# Patient Record
Sex: Male | Born: 1950 | Marital: Married
Health system: Southern US, Community
[De-identification: ages and names within clinical notes are randomized; demographics above are authoritative.]

## PROBLEM LIST (undated history)

## (undated) DIAGNOSIS — G8929 Other chronic pain: Secondary | ICD-10-CM

## (undated) DIAGNOSIS — E119 Type 2 diabetes mellitus without complications: Secondary | ICD-10-CM

## (undated) DIAGNOSIS — I10 Essential (primary) hypertension: Secondary | ICD-10-CM

## (undated) DIAGNOSIS — I4891 Unspecified atrial fibrillation: Secondary | ICD-10-CM

## (undated) DIAGNOSIS — J449 Chronic obstructive pulmonary disease, unspecified: Secondary | ICD-10-CM

## (undated) DIAGNOSIS — E039 Hypothyroidism, unspecified: Secondary | ICD-10-CM

---

## 2014-03-23 ENCOUNTER — Inpatient Hospital Stay
Admission: RE | Admit: 2014-03-23 | Discharge: 2014-05-01 | Disposition: E | Payer: Medicare HMO | Source: Ambulatory Visit | Attending: Internal Medicine | Admitting: Internal Medicine

## 2014-03-23 ENCOUNTER — Other Ambulatory Visit (HOSPITAL_COMMUNITY): Payer: Self-pay

## 2014-03-23 DIAGNOSIS — J80 Acute respiratory distress syndrome: Secondary | ICD-10-CM | POA: Insufficient documentation

## 2014-03-23 DIAGNOSIS — C3490 Malignant neoplasm of unspecified part of unspecified bronchus or lung: Secondary | ICD-10-CM | POA: Insufficient documentation

## 2014-03-23 DIAGNOSIS — J9601 Acute respiratory failure with hypoxia: Secondary | ICD-10-CM

## 2014-03-23 DIAGNOSIS — I82C12 Acute embolism and thrombosis of left internal jugular vein: Secondary | ICD-10-CM

## 2014-03-23 DIAGNOSIS — J9690 Respiratory failure, unspecified, unspecified whether with hypoxia or hypercapnia: Secondary | ICD-10-CM

## 2014-03-23 DIAGNOSIS — I82C19 Acute embolism and thrombosis of unspecified internal jugular vein: Secondary | ICD-10-CM | POA: Insufficient documentation

## 2014-03-23 DIAGNOSIS — Z452 Encounter for adjustment and management of vascular access device: Secondary | ICD-10-CM | POA: Insufficient documentation

## 2014-03-23 HISTORY — DX: Other chronic pain: G89.29

## 2014-03-23 HISTORY — DX: Type 2 diabetes mellitus without complications: E11.9

## 2014-03-23 HISTORY — DX: Essential (primary) hypertension: I10

## 2014-03-23 HISTORY — DX: Hypothyroidism, unspecified: E03.9

## 2014-03-23 HISTORY — DX: Chronic obstructive pulmonary disease, unspecified: J44.9

## 2014-03-23 HISTORY — DX: Unspecified atrial fibrillation: I48.91

## 2014-03-23 LAB — BLOOD GAS, ARTERIAL
Acid-Base Excess: 15.4 mmol/L — ABNORMAL HIGH (ref 0.0–2.0)
Bicarbonate: 41.2 mEq/L — ABNORMAL HIGH (ref 20.0–24.0)
FIO2: 0.8 %
LHR: 15 {breaths}/min
O2 SAT: 97 %
PATIENT TEMPERATURE: 98.6
PEEP/CPAP: 8 cmH2O
TCO2: 43.3 mmol/L (ref 0–100)
VT: 500 mL
pCO2 arterial: 68.6 mmHg (ref 35.0–45.0)
pH, Arterial: 7.395 (ref 7.350–7.450)
pO2, Arterial: 95 mmHg (ref 80.0–100.0)

## 2014-03-24 LAB — BLOOD GAS, ARTERIAL
ACID-BASE EXCESS: 15.6 mmol/L — AB (ref 0.0–2.0)
Bicarbonate: 41.4 mEq/L — ABNORMAL HIGH (ref 20.0–24.0)
FIO2: 0.7 %
MECHVT: 500 mL
O2 SAT: 98.4 %
PCO2 ART: 70.5 mmHg — AB (ref 35.0–45.0)
PEEP: 8 cmH2O
Patient temperature: 98.6
RATE: 15 resp/min
TCO2: 43.6 mmol/L (ref 0–100)
pH, Arterial: 7.387 (ref 7.350–7.450)
pO2, Arterial: 93.7 mmHg (ref 80.0–100.0)

## 2014-03-24 LAB — CBC WITH DIFFERENTIAL/PLATELET
Basophils Absolute: 0 10*3/uL (ref 0.0–0.1)
Basophils Relative: 0 % (ref 0–1)
EOS ABS: 0.2 10*3/uL (ref 0.0–0.7)
Eosinophils Relative: 3 % (ref 0–5)
HCT: 25.8 % — ABNORMAL LOW (ref 39.0–52.0)
HEMOGLOBIN: 7.3 g/dL — AB (ref 13.0–17.0)
Lymphocytes Relative: 6 % — ABNORMAL LOW (ref 12–46)
Lymphs Abs: 0.4 10*3/uL — ABNORMAL LOW (ref 0.7–4.0)
MCH: 28.7 pg (ref 26.0–34.0)
MCHC: 28.3 g/dL — AB (ref 30.0–36.0)
MCV: 101.6 fL — ABNORMAL HIGH (ref 78.0–100.0)
MONOS PCT: 10 % (ref 3–12)
Monocytes Absolute: 0.7 10*3/uL (ref 0.1–1.0)
Neutro Abs: 5.8 10*3/uL (ref 1.7–7.7)
Neutrophils Relative %: 81 % — ABNORMAL HIGH (ref 43–77)
Platelets: 248 10*3/uL (ref 150–400)
RBC: 2.54 MIL/uL — ABNORMAL LOW (ref 4.22–5.81)
RDW: 18.8 % — ABNORMAL HIGH (ref 11.5–15.5)
WBC: 7.2 10*3/uL (ref 4.0–10.5)

## 2014-03-24 LAB — C-REACTIVE PROTEIN: CRP: 22.7 mg/dL — ABNORMAL HIGH (ref <0.60)

## 2014-03-24 LAB — FERRITIN: FERRITIN: 411 ng/mL — AB (ref 22–322)

## 2014-03-24 LAB — COMPREHENSIVE METABOLIC PANEL
ALT: 13 U/L (ref 0–53)
AST: 16 U/L (ref 0–37)
Albumin: 1.8 g/dL — ABNORMAL LOW (ref 3.5–5.2)
Alkaline Phosphatase: 83 U/L (ref 39–117)
Anion gap: 7 (ref 5–15)
BUN: 46 mg/dL — ABNORMAL HIGH (ref 6–23)
CO2: 41 mEq/L (ref 19–32)
Calcium: 9.1 mg/dL (ref 8.4–10.5)
Chloride: 98 mEq/L (ref 96–112)
Creatinine, Ser: 1.24 mg/dL (ref 0.50–1.35)
GFR calc Af Amer: 69 mL/min — ABNORMAL LOW (ref >90)
GFR, EST NON AFRICAN AMERICAN: 60 mL/min — AB (ref >90)
Glucose, Bld: 177 mg/dL — ABNORMAL HIGH (ref 70–99)
Potassium: 5.2 mEq/L (ref 3.7–5.3)
Sodium: 146 mEq/L (ref 137–147)
Total Bilirubin: 0.7 mg/dL (ref 0.3–1.2)
Total Protein: 6.9 g/dL (ref 6.0–8.3)

## 2014-03-24 LAB — HEMOGLOBIN A1C
HEMOGLOBIN A1C: 5.8 % — AB (ref <5.7)
Mean Plasma Glucose: 120 mg/dL — ABNORMAL HIGH (ref <117)

## 2014-03-24 LAB — IRON AND TIBC
Iron: 16 ug/dL — ABNORMAL LOW (ref 42–135)
Saturation Ratios: 7 % — ABNORMAL LOW (ref 20–55)
TIBC: 230 ug/dL (ref 215–435)
UIBC: 214 ug/dL (ref 125–400)

## 2014-03-24 LAB — TSH: TSH: 3.02 u[IU]/mL (ref 0.350–4.500)

## 2014-03-24 LAB — GAMMA GT: GGT: 11 U/L (ref 7–51)

## 2014-03-24 LAB — VITAMIN B12: Vitamin B-12: 493 pg/mL (ref 211–911)

## 2014-03-24 LAB — PROTIME-INR
INR: 1.12 (ref 0.00–1.49)
PROTHROMBIN TIME: 14.5 s (ref 11.6–15.2)

## 2014-03-24 LAB — DIGOXIN LEVEL
Digoxin Level: 1.3 ng/mL (ref 0.8–2.0)
Digoxin Level: 1.2 ng/mL (ref 0.8–2.0)

## 2014-03-24 LAB — MAGNESIUM: Magnesium: 2.7 mg/dL — ABNORMAL HIGH (ref 1.5–2.5)

## 2014-03-24 LAB — PROCALCITONIN: PROCALCITONIN: 1.44 ng/mL

## 2014-03-24 LAB — PHOSPHORUS: PHOSPHORUS: 4 mg/dL (ref 2.3–4.6)

## 2014-03-25 ENCOUNTER — Other Ambulatory Visit (HOSPITAL_COMMUNITY): Payer: Self-pay

## 2014-03-25 ENCOUNTER — Encounter: Payer: Self-pay | Admitting: Adult Health

## 2014-03-25 DIAGNOSIS — J9601 Acute respiratory failure with hypoxia: Secondary | ICD-10-CM | POA: Diagnosis not present

## 2014-03-25 DIAGNOSIS — J9690 Respiratory failure, unspecified, unspecified whether with hypoxia or hypercapnia: Secondary | ICD-10-CM | POA: Insufficient documentation

## 2014-03-25 DIAGNOSIS — J80 Acute respiratory distress syndrome: Secondary | ICD-10-CM | POA: Insufficient documentation

## 2014-03-25 DIAGNOSIS — J9600 Acute respiratory failure, unspecified whether with hypoxia or hypercapnia: Secondary | ICD-10-CM

## 2014-03-25 DIAGNOSIS — C3490 Malignant neoplasm of unspecified part of unspecified bronchus or lung: Secondary | ICD-10-CM | POA: Diagnosis not present

## 2014-03-25 DIAGNOSIS — Z452 Encounter for adjustment and management of vascular access device: Secondary | ICD-10-CM | POA: Diagnosis not present

## 2014-03-25 LAB — BASIC METABOLIC PANEL
ANION GAP: 7 (ref 5–15)
BUN: 82 mg/dL — AB (ref 6–23)
CALCIUM: 8.9 mg/dL (ref 8.4–10.5)
CO2: 40 meq/L — AB (ref 19–32)
CREATININE: 1.52 mg/dL — AB (ref 0.50–1.35)
Chloride: 97 mEq/L (ref 96–112)
GFR calc Af Amer: 54 mL/min — ABNORMAL LOW (ref >90)
GFR calc non Af Amer: 47 mL/min — ABNORMAL LOW (ref >90)
Glucose, Bld: 232 mg/dL — ABNORMAL HIGH (ref 70–99)
Potassium: 5.5 mEq/L — ABNORMAL HIGH (ref 3.7–5.3)
Sodium: 144 mEq/L (ref 137–147)

## 2014-03-25 LAB — PROTIME-INR
INR: 1.16 (ref 0.00–1.49)
PROTHROMBIN TIME: 14.9 s (ref 11.6–15.2)

## 2014-03-25 LAB — HEMOGLOBIN AND HEMATOCRIT, BLOOD
HEMATOCRIT: 25.9 % — AB (ref 39.0–52.0)
Hemoglobin: 7.4 g/dL — ABNORMAL LOW (ref 13.0–17.0)

## 2014-03-25 NOTE — Consult Note (Signed)
Name: Joseph Mack MRN: [id number] DOB: 1951-04-20    ADMISSION DATE:  03/22/2014 CONSULTATION DATE:  03/25/2014  REFERRING MD :  Dahlia Client  CHIEF COMPLAINT:  VDRF  BRIEF PATIENT DESCRIPTION: 63 y.o. M with severe COPD on 8 - 12 L high flow O2 chronically, recently admitted to [facility] and underwent left VATS with bullectomy, complete decortication, wedge resection, visceral pleurectomy.  Unable to be liberated from vent post op, required trach/PEG.  Transferred to [facility] 11/23, PCCM consulted for assistance with vent management.  SIGNIFICANT EVENTS  10/26 - VATS at [facility] (LUL bullectomy, lysis, complete decortication, wedge resection, visceral pleurectomy) 11/10 - trach 11/20 - PEG 11/23 - transferred to [facility] 11/25 - PCCM consulted  STUDIES:  CXR 11/125 >>> pulm edema, emphysema, LUL bullectomy.   HISTORY OF PRESENT ILLNESS:  Pt is encephelopathic; therefore, this HPI is obtained from chart review. Joseph Mack is a 63 y.o. 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 He was admitted to [facility] on 10/26 and underwent VATS with fluoroscopic bullectomy of LUL apical bulla, thorascopic lysis, complete thorascopic decortication, wedge resection, thorascopic visceral pleurectomy and left chest tube placement. Pt was intubated prior to surgery but due to severe underlying lung disease, was unable to be liberated from the ventilator.  He subsequently required tracheostomy and since then has had high FiO2 and PEEP requirements to maintain SpO2 > 85% (70 - 85% FiO2 and 8-12 PEEP).  Pt was transferred to [facility] on 11/23 and PCCM was consulted for assistance with vent management.  Note prior to admission, pt was on 8 - 12 L of high flow O2 24/7.  PAST MEDICAL HISTORY :   has a past medical history of COPD, severe; Diabetes; Hypertension; Atrial  fibrillation; Hypothyroid; and Chronic pain.  has no past surgical history on file. Prior to Admission medications   Not on File   Allergies not on file  FAMILY HISTORY:  family history is not on file. SOCIAL HISTORY:    REVIEW OF SYSTEMS:  Unable to obtain as pt is encephalopathic.  SUBJECTIVE:   VITAL SIGNS:  VSS.    PHYSICAL EXAMINATION: General: Morbidly obese male, resting in bed, in NAD. Neuro: Sedated, follows commands intermittently. HEENT: [place]/AT. PERRL, sclerae anicteric.  #6 XLT trach in place. Cardiovascular: RRR, no M/R/G.  Lungs: Respirations shallow, decreased air movement, scattered wheezes.  On trach. Abdomen: Morbidly obese, PEG in place, BS x 4, soft, NT/ND.  Musculoskeletal: No gross deformities, no edema. Venous stasis changes Skin: Intact, warm, no rashes.   Recent Labs Lab 03/24/14 0642  NA 146  K 5.2  CL 98  CO2 41*  BUN 46*  CREATININE 1.24  GLUCOSE 177*    Recent Labs Lab 03/24/14 0642 03/25/14 0624  HGB 7.3* 7.4*  HCT 25.8* 25.9*  WBC 7.2  --   PLT 248  --    Dg Chest Port 1 View  03/24/2014   CLINICAL DATA:  Respiratory distress  EXAM: PORTABLE CHEST - 1 VIEW  COMPARISON:  01/21/2014  FINDINGS: Tracheostomy tube is present.  A previously noted emphysematous bleb at the left apex is no longer visualized. This was likely resected given the split lung scan was performed 01/21/2014. Emphysematous changes are noted in the remaining lungs. There is diffuse interstitial opacity. Cardiomegaly which is chronic. No gross change in upper mediastinal contours, distorted by the new volume loss  on the left.  IMPRESSION: 1. Pulmonary edema superimposed on emphysema. 2. Interval left blebectomy or upper lobectomy. Without more recent comparison, an opacity at the left apex is indeterminate - cannot exclude layering pleural fluid.   Electronically Signed   By: Jorje Guild M.D.   On: 03/24/2014 02:38    ASSESSMENT / PLAN:  VDRF s/p  trach Tracheostomy status Severe COPD per report (no PFT's available) - s/p LUL bullectomy and wedge resection, visceral pleurectomy. Recs: Continue full vent support, appears to be doing well on AC. abg reviewed, keep same mV VAP bundle. Anticipate difficulties in weaning given long hx of high O2 demands (8 - 12 L high flow O2).  Will likely be vent dependent Continue budesonide, duonebs, prednisone. Follow cultures (tracheal aspirate sent). CXR in AM. Keep neg in balance After secretions improve, could consider high flow O2 trach collar if wob is not an issue At some stage may assess fixed shunt and if peep to 5 even changes any O2 needs For today goal to 60% if able Would be reasonable to check PA pressures  Montey Hora, PA - [facility] [place] [facility] Pgr: [phone]  or [phone] 03/25/2014, 11:35 AM   STAFF NOTE: I, Merrie Roof, MD FACP have personally reviewed patient's available data, including medical history, events of note, physical examination and test results as part of my evaluation. I have discussed with resident/NP and other care providers such as pharmacist, RN and RRT. In addition, I personally evaluated patient and elicited key findings of: obese, distant BS, may be able to attempt TC with high O2 in future if wob not an issue recognizing   Lavon Paganini. Titus Mould, MD, [place] Pgr: [place] [facility] 03/25/2014 2:27 PM

## 2014-03-26 LAB — CULTURE, RESPIRATORY W GRAM STAIN

## 2014-03-26 LAB — BASIC METABOLIC PANEL
Anion gap: 6 (ref 5–15)
BUN: 90 mg/dL — ABNORMAL HIGH (ref 6–23)
CO2: 42 meq/L — AB (ref 19–32)
CREATININE: 1.53 mg/dL — AB (ref 0.50–1.35)
Calcium: 9 mg/dL (ref 8.4–10.5)
Chloride: 102 mEq/L (ref 96–112)
GFR calc Af Amer: 54 mL/min — ABNORMAL LOW (ref >90)
GFR calc non Af Amer: 46 mL/min — ABNORMAL LOW (ref >90)
GLUCOSE: 176 mg/dL — AB (ref 70–99)
Potassium: 5 mEq/L (ref 3.7–5.3)
Sodium: 150 mEq/L — ABNORMAL HIGH (ref 137–147)

## 2014-03-26 LAB — ABO/RH: ABO/RH(D): O POS

## 2014-03-26 LAB — CULTURE, RESPIRATORY

## 2014-03-26 LAB — CBC
HEMATOCRIT: 25.8 % — AB (ref 39.0–52.0)
Hemoglobin: 7.1 g/dL — ABNORMAL LOW (ref 13.0–17.0)
MCH: 29.2 pg (ref 26.0–34.0)
MCHC: 27.5 g/dL — ABNORMAL LOW (ref 30.0–36.0)
MCV: 106.2 fL — ABNORMAL HIGH (ref 78.0–100.0)
Platelets: 302 10*3/uL (ref 150–400)
RBC: 2.43 MIL/uL — AB (ref 4.22–5.81)
RDW: 18 % — ABNORMAL HIGH (ref 11.5–15.5)
WBC: 6.6 10*3/uL (ref 4.0–10.5)

## 2014-03-26 LAB — PROTIME-INR
INR: 1.23 (ref 0.00–1.49)
Prothrombin Time: 15.7 seconds — ABNORMAL HIGH (ref 11.6–15.2)

## 2014-03-26 LAB — PREPARE RBC (CROSSMATCH)

## 2014-03-27 DIAGNOSIS — J9601 Acute respiratory failure with hypoxia: Secondary | ICD-10-CM

## 2014-03-27 DIAGNOSIS — J80 Acute respiratory distress syndrome: Secondary | ICD-10-CM

## 2014-03-27 LAB — CBC
HEMATOCRIT: 29 % — AB (ref 39.0–52.0)
Hemoglobin: 8.2 g/dL — ABNORMAL LOW (ref 13.0–17.0)
MCH: 28.6 pg (ref 26.0–34.0)
MCHC: 28.3 g/dL — AB (ref 30.0–36.0)
MCV: 101 fL — ABNORMAL HIGH (ref 78.0–100.0)
PLATELETS: 290 10*3/uL (ref 150–400)
RBC: 2.87 MIL/uL — ABNORMAL LOW (ref 4.22–5.81)
RDW: 18.6 % — AB (ref 11.5–15.5)
WBC: 6.6 10*3/uL (ref 4.0–10.5)

## 2014-03-27 LAB — PROTIME-INR
INR: 1.29 (ref 0.00–1.49)
Prothrombin Time: 16.2 seconds — ABNORMAL HIGH (ref 11.6–15.2)

## 2014-03-27 LAB — BASIC METABOLIC PANEL
Anion gap: 10 (ref 5–15)
Anion gap: 9 (ref 5–15)
BUN: 130 mg/dL — ABNORMAL HIGH (ref 6–23)
BUN: 130 mg/dL — AB (ref 6–23)
CALCIUM: 8.6 mg/dL (ref 8.4–10.5)
CALCIUM: 8.5 mg/dL (ref 8.4–10.5)
CO2: 38 meq/L — AB (ref 19–32)
CO2: 37 mEq/L — ABNORMAL HIGH (ref 19–32)
CREATININE: 2.31 mg/dL — AB (ref 0.50–1.35)
Chloride: 100 mEq/L (ref 96–112)
Chloride: 99 mEq/L (ref 96–112)
Creatinine, Ser: 2.53 mg/dL — ABNORMAL HIGH (ref 0.50–1.35)
GFR calc Af Amer: 33 mL/min — ABNORMAL LOW (ref >90)
GFR, EST AFRICAN AMERICAN: 29 mL/min — AB (ref >90)
GFR, EST NON AFRICAN AMERICAN: 28 mL/min — AB (ref >90)
GFR, EST NON AFRICAN AMERICAN: 25 mL/min — AB (ref >90)
GLUCOSE: 162 mg/dL — AB (ref 70–99)
Glucose, Bld: 139 mg/dL — ABNORMAL HIGH (ref 70–99)
Potassium: 6.6 mEq/L (ref 3.7–5.3)
Potassium: 6.5 mEq/L (ref 3.7–5.3)
SODIUM: 148 meq/L — AB (ref 137–147)
Sodium: 145 mEq/L (ref 137–147)

## 2014-03-27 LAB — POTASSIUM
Potassium: 7.5 mEq/L (ref 3.7–5.3)
Potassium: 6.4 mEq/L — ABNORMAL HIGH (ref 3.7–5.3)

## 2014-03-27 NOTE — Progress Notes (Signed)
   Name: Joseph Mack MRN: [id number] DOB: 04-Nov-1950    ADMISSION DATE:  03/22/2014 CONSULTATION DATE:  03/25/2014  REFERRING MD :  Dahlia Client  CHIEF COMPLAINT:  VDRF  BRIEF PATIENT DESCRIPTION: 63 y.o. M with severe COPD on 8 - 12 L high flow O2 chronically, recently admitted to [facility] and underwent left VATS with bullectomy, complete decortication, wedge resection, visceral pleurectomy.  Unable to be liberated from vent post op, required trach/PEG.  Transferred to [facility] 11/23, PCCM consulted for assistance with vent management.  SIGNIFICANT EVENTS  10/26 - VATS at [facility] (LUL bullectomy, lysis, complete decortication, wedge resection, visceral pleurectomy) 11/10 - trach 11/20 - PEG 11/23 - transferred to [facility] 11/25 - PCCM consulted  STUDIES:  CXR 11/125 >>> pulm edema, emphysema, LUL bullectomy.  SUBJECTIVE:  No events over night   VITAL SIGNS:  VSS.    PHYSICAL EXAMINATION: General: Morbidly obese male, resting in bed, in NAD. Neuro: Sedated, follows commands intermittently. HEENT: [place]/AT. PERRL, sclerae anicteric.  #6 XLT trach in place. Cardiovascular: RRR, no M/R/G.  Lungs: Respirations shallow, decreased air movement, scattered wheezes.  On trach. Abdomen: Morbidly obese, PEG in place, BS x 4, soft, NT/ND.  Musculoskeletal: No gross deformities, no edema. Venous stasis changes Skin: Intact, warm, no rashes.   Recent Labs Lab 03/25/14 1455 03/26/14 0615 03/27/14 0714  NA 144 150* 148*  K 5.5* 5.0 6.6*  CL 97 102 100  CO2 40* 42* 38*  BUN 82* 90* 130*  CREATININE 1.52* 1.53* 2.31*  GLUCOSE 232* 176* 139*    Recent Labs Lab 03/24/14 0642 03/25/14 0624 03/26/14 0615 03/27/14 0714  HGB 7.3* 7.4* 7.1* 8.2*  HCT 25.8* 25.9* 25.8* 29.0*  WBC 7.2  --  6.6 6.6  PLT 248  --  302 290   No results found.  ASSESSMENT / PLAN:  VDRF s/p trach Tracheostomy status Severe COPD per report (no PFT's available) - s/p LUL bullectomy and wedge resection, visceral  pleurectomy. Recs: Continue full vent support, appears to be doing well on AC. abg reviewed, keep same mV VAP bundle. Anticipate difficulties in weaning given long hx of high O2 demands (8 - 12 L high flow O2).  Will likely be vent dependent Continue budesonide, duonebs, prednisone.  Mariel Sleet [place] [facility] 03/27/2014 9:20 AM

## 2014-03-28 ENCOUNTER — Other Ambulatory Visit (HOSPITAL_COMMUNITY): Payer: Self-pay

## 2014-03-28 LAB — TYPE AND SCREEN
ABO/RH(D): O POS
Antibody Screen: NEGATIVE
Unit division: 0
Unit division: 0

## 2014-03-28 LAB — BASIC METABOLIC PANEL
Anion gap: 11 (ref 5–15)
BUN: 146 mg/dL — AB (ref 6–23)
CHLORIDE: 95 meq/L — AB (ref 96–112)
CO2: 39 meq/L — AB (ref 19–32)
Calcium: 8.5 mg/dL (ref 8.4–10.5)
Creatinine, Ser: 2.67 mg/dL — ABNORMAL HIGH (ref 0.50–1.35)
GFR calc non Af Amer: 24 mL/min — ABNORMAL LOW (ref >90)
GFR, EST AFRICAN AMERICAN: 27 mL/min — AB (ref >90)
Glucose, Bld: 177 mg/dL — ABNORMAL HIGH (ref 70–99)
POTASSIUM: 5.6 meq/L — AB (ref 3.7–5.3)
Sodium: 145 mEq/L (ref 137–147)

## 2014-03-28 LAB — PROTIME-INR
INR: 1.46 (ref 0.00–1.49)
Prothrombin Time: 17.9 seconds — ABNORMAL HIGH (ref 11.6–15.2)

## 2014-03-28 LAB — HEMOGLOBIN AND HEMATOCRIT, BLOOD
HEMATOCRIT: 28.7 % — AB (ref 39.0–52.0)
Hemoglobin: 8 g/dL — ABNORMAL LOW (ref 13.0–17.0)

## 2014-03-29 ENCOUNTER — Other Ambulatory Visit (HOSPITAL_COMMUNITY): Payer: Self-pay

## 2014-03-29 LAB — POTASSIUM: Potassium: 5 mEq/L (ref 3.7–5.3)

## 2014-03-29 LAB — PROTIME-INR
INR: 1.71 — AB (ref 0.00–1.49)
PROTHROMBIN TIME: 20.2 s — AB (ref 11.6–15.2)

## 2014-03-30 ENCOUNTER — Other Ambulatory Visit (HOSPITAL_COMMUNITY): Payer: Self-pay

## 2014-03-30 DIAGNOSIS — J80 Acute respiratory distress syndrome: Secondary | ICD-10-CM | POA: Diagnosis not present

## 2014-03-30 DIAGNOSIS — J9601 Acute respiratory failure with hypoxia: Secondary | ICD-10-CM | POA: Diagnosis not present

## 2014-03-30 DIAGNOSIS — C3490 Malignant neoplasm of unspecified part of unspecified bronchus or lung: Secondary | ICD-10-CM | POA: Diagnosis not present

## 2014-03-30 LAB — RENAL FUNCTION PANEL
Albumin: 1.5 g/dL — ABNORMAL LOW (ref 3.5–5.2)
Anion gap: 13 (ref 5–15)
BUN: 183 mg/dL — AB (ref 6–23)
CALCIUM: 8 mg/dL — AB (ref 8.4–10.5)
CHLORIDE: 91 meq/L — AB (ref 96–112)
CO2: 39 meq/L — AB (ref 19–32)
Creatinine, Ser: 2.78 mg/dL — ABNORMAL HIGH (ref 0.50–1.35)
GFR calc Af Amer: 26 mL/min — ABNORMAL LOW (ref >90)
GFR, EST NON AFRICAN AMERICAN: 23 mL/min — AB (ref >90)
GLUCOSE: 115 mg/dL — AB (ref 70–99)
Phosphorus: 5.2 mg/dL — ABNORMAL HIGH (ref 2.3–4.6)
Potassium: 5 mEq/L (ref 3.7–5.3)
Sodium: 143 mEq/L (ref 137–147)

## 2014-03-30 LAB — BLOOD GAS, ARTERIAL
Acid-Base Excess: 15.7 mmol/L — ABNORMAL HIGH (ref 0.0–2.0)
BICARBONATE: 41.9 meq/L — AB (ref 20.0–24.0)
FIO2: 1 %
O2 SAT: 98.1 %
PEEP: 12 cmH2O
Patient temperature: 98.6
RATE: 12 resp/min
TCO2: 44.2 mmol/L (ref 0–100)
VT: 500 mL
pCO2 arterial: 76.7 mmHg (ref 35.0–45.0)
pH, Arterial: 7.356 (ref 7.350–7.450)
pO2, Arterial: 104 mmHg — ABNORMAL HIGH (ref 80.0–100.0)

## 2014-03-30 LAB — CBC WITH DIFFERENTIAL/PLATELET
Basophils Absolute: 0 10*3/uL (ref 0.0–0.1)
Basophils Relative: 0 % (ref 0–1)
EOS ABS: 0.1 10*3/uL (ref 0.0–0.7)
Eosinophils Relative: 2 % (ref 0–5)
HCT: 23.8 % — ABNORMAL LOW (ref 39.0–52.0)
HEMOGLOBIN: 7 g/dL — AB (ref 13.0–17.0)
LYMPHS ABS: 0.6 10*3/uL — AB (ref 0.7–4.0)
Lymphocytes Relative: 9 % — ABNORMAL LOW (ref 12–46)
MCH: 29.9 pg (ref 26.0–34.0)
MCHC: 29.4 g/dL — AB (ref 30.0–36.0)
MCV: 101.7 fL — ABNORMAL HIGH (ref 78.0–100.0)
MONOS PCT: 5 % (ref 3–12)
Monocytes Absolute: 0.3 10*3/uL (ref 0.1–1.0)
NEUTROS ABS: 5.3 10*3/uL (ref 1.7–7.7)
Neutrophils Relative %: 84 % — ABNORMAL HIGH (ref 43–77)
Platelets: 311 10*3/uL (ref 150–400)
RBC: 2.34 MIL/uL — ABNORMAL LOW (ref 4.22–5.81)
RDW: 17 % — ABNORMAL HIGH (ref 11.5–15.5)
WBC: 6.3 10*3/uL (ref 4.0–10.5)

## 2014-03-30 LAB — GRAM STAIN: SPECIAL REQUESTS: NORMAL

## 2014-03-30 LAB — PROTIME-INR
INR: 1.84 — ABNORMAL HIGH (ref 0.00–1.49)
Prothrombin Time: 21.4 seconds — ABNORMAL HIGH (ref 11.6–15.2)

## 2014-03-30 LAB — DIGOXIN LEVEL: DIGOXIN LVL: 1.8 ng/mL (ref 0.8–2.0)

## 2014-03-30 LAB — TSH: TSH: 1.79 u[IU]/mL (ref 0.350–4.500)

## 2014-03-30 LAB — MAGNESIUM: MAGNESIUM: 3.3 mg/dL — AB (ref 1.5–2.5)

## 2014-03-30 NOTE — Procedures (Signed)
Bedside Bronchoscopy Procedure Note Joseph Mack [id number] December 22, 1950  Procedure: Bronchoscopy Indications: Obtain specimens for culture and/or other diagnostic studies  Procedure Details: ET Tube Size: ET Tube secured at lip (cm): Bite block in place: No In preparation for procedure, Patient hyper-oxygenated with 100 % FiO2 Airway entered and the following bronchi were examined: RUL, RML, RLL, LUL, LLL and Bronchi.   Bronchoscope removed.  , Patient placed back on 100% FiO2 at conclusion of procedure.    Evaluation There were no vitals taken for this visit. Breath Sounds:Rhonch O2 sats: stable throughout Patient's Current Condition: stable Specimens:  Sent serosanguinous fluid Complications: No apparent complications Patient did tolerate procedure well.   Gonzella Lex 03/30/2014, 3:43 PM

## 2014-03-30 NOTE — Procedures (Signed)
Bronchoscopy Procedure Note Joseph Mack [id number] 04/22/1951  Procedure: Bronchoscopy Indications: Diagnostic evaluation of the airways, Obtain specimens for culture and/or other diagnostic studies and Remove secretions  Procedure Details Consent: Risks of procedure as well as the alternatives and risks of each were explained to the (patient/caregiver).  Consent for procedure obtained. Time Out: Verified patient identification, verified procedure, site/side was marked, verified correct patient position, special equipment/implants available, medications/allergies/relevent history reviewed, required imaging and test results available.  Performed  In preparation for procedure, patient was given 100% FiO2 and bronchoscope lubricated. Sedation: Benzodiazepines and Etomidate  Airway entered and the following bronchi were examined: RUL, RML, RLL, LUL, LLL and Bronchi.   Procedures performed: Brushings performed - no Peep to 8 prior to reduce MAP Bronchoscope removed.  , Patient placed back on 100% FiO2 at conclusion of procedure.    Evaluation Hemodynamic Status: BP stable throughout; O2 sats: stable throughout Patient's Current Condition: stable Specimens:  Sent purulent fluid Complications: No apparent complications Patient did tolerate procedure well.   Joseph Mack. 03/30/2014   1. Moderate thick white secretions BI and RLL, BAL perfooremed 2. Left wnl 3. No mass lesions 4. A ton of distal plugs lavaged RLL, RML  Tolerated well  Joseph Mack. Titus Mould, MD, [place] Pgr: [place] [facility]

## 2014-03-30 NOTE — Progress Notes (Signed)
Name: ANOOP HEMMER MRN: [id number] DOB: August 03, 1950    ADMISSION DATE:  03/06/2014 CONSULTATION DATE:  03/25/2014  REFERRING MD :  Dahlia Client  CHIEF COMPLAINT:  VDRF  BRIEF PATIENT DESCRIPTION: 63 y.o. M with severe COPD on 8 - 12 L high flow O2 chronically, recently admitted to [facility] and underwent left VATS with bullectomy, complete decortication, wedge resection, visceral pleurectomy.  Unable to be liberated from vent post op, required trach/PEG.  Transferred to [facility] 11/23, PCCM consulted for assistance with vent management.  SIGNIFICANT EVENTS  10/26 - VATS at [facility] (LUL bullectomy, lysis, complete decortication, wedge resection, visceral pleurectomy) 11/10 - trach 11/20 - PEG 11/23 - transferred to [facility] 11/25 - PCCM consulted  STUDIES:  CXR 11/125 >>> pulm edema, emphysema, LUL bullectomy.  SUBJECTIVE:  Now on High FIO2  VITAL SIGNS:  VSS.    PHYSICAL EXAMINATION: General: Morbidly obese male, resting in bed, in NAD. Neuro: Sedated, follows commands intermittently. HEENT: [place]/AT. PERRL, sclerae anicteric.  #6 XLT trach in place. Cardiovascular: RRR, no M/R/G.  Lungs: Respirations shallow, decreased air movement, full vent support. Abdomen: Morbidly obese, PEG in place, BS x 4, soft, NT/ND.  Musculoskeletal: No gross deformities, no edema. Venous stasis changes Skin: Intact, warm, no rashes.   Recent Labs Lab 03/27/14 1058  03/28/14 0421 03/29/14 0425 03/30/14 0234  NA 145  --  145  --  143  K 6.5*  < > 5.6* 5.0 5.0  CL 99  --  95*  --  91*  CO2 37*  --  39*  --  39*  BUN 130*  --  146*  --  183*  CREATININE 2.53*  --  2.67*  --  2.78*  GLUCOSE 162*  --  177*  --  115*  < > = values in this interval not displayed.  Recent Labs Lab 03/26/14 0615 03/27/14 0714 03/28/14 0421 03/30/14 0234  HGB 7.1* 8.2* 8.0* 7.0*  HCT 25.8* 29.0* 28.7* 23.8*  WBC 6.6 6.6  --  6.3  PLT 302 290  --  311   Dg Chest Port 1 View  03/30/2014   CLINICAL DATA:  Respiratory  failure.  EXAM: PORTABLE CHEST - 1 VIEW  COMPARISON:  03/29/2014 .  FINDINGS: Tracheostomy tube in stable position. Postsurgical changes left lung. Diffuse bilateral pulmonary infiltrates with slight improvement. Left apical pleural thickening unchanged. Loculated pleural effusion cannot be excluded. No pneumothorax. No acute osseus abnormality P  IMPRESSION: 1. Postsurgical changes left lung. Surgical chain sutures noted over the left upper lung. Tracheostomy tube in stable position.  2. Persistent bilateral trauma pulmonary infiltrates with slight improvement. Next item left apical pleural thickening again noted. Loculated left apical pleural effusion cannot be excluded.   Electronically Signed   By: Marcello Moores  Register   On: 03/30/2014 07:44   Dg Chest Port 1 View  03/29/2014   CLINICAL DATA:  Respiratory failure  EXAM: PORTABLE CHEST - 1 VIEW  COMPARISON:  03/28/2014  FINDINGS: Postsurgical changes in the left lung apex. Suspected small left pleural effusion.  Underlying chronic interstitial markings/ fibrosis. Superimposed mild edema or infection is not excluded.  No pneumothorax.  Cardiomegaly.  Tracheostomy at the thoracic inlet.  IMPRESSION: Postsurgical changes in the left lung apex.  Suspected small left pleural effusion.  No pneumothorax.  Chronic interstitial lung disease. Superimposed mild edema or infection is not excluded.   Electronically Signed   By: Julian Hy M.D.   On: 03/29/2014 07:19    ASSESSMENT / PLAN:  VDRF s/p trach Tracheostomy status Severe COPD per report (no PFT's available) - s/p LUL bullectomy and wedge resection, visceral pleurectomy. Bilateral pulmonary infiltrates   Discussion  looks like he has probably developed some degree of pulmonary fibrosis since Aug this year... Suspect prior ALI. Unclear how much of his CXR abnormalities are scarring vs possible pneumonitis vs infection vs edema. His creatinine is rising w/ lasix. Steroids were startedat some stage Long  discussion w/ wife. She does not wish for prolonged care, if he can't come off vent  Recs: Worsening status peep needs to 12, secretions inctreased abg reviewed, likely can reduce to 70% Rt reports tons of plugs, we have limited reserve at baseline now left lung poorly continuing to O2 needs  High risk to decline further, will consider bronch today  Will need abg follow up Would place mucomyst's likely , will d/w family pcxr needed in am   BABCOCK,PETE 03/30/2014 8:38 AM  STAFF NOTE: I, Merrie Roof, MD FACP have personally reviewed patient's available data, including medical history, events of note, physical examination and test results as part of my evaluation. I have discussed with resident/NP and other care providers such as pharmacist, RN and RRT. In addition, I personally evaluated patient and elicited key findings of: ronchi rt , secretions high worsening status, peep to 12, to goal 70% , abg reviewed, consider bronch, may need further peep increase, hold T in case bronch   Lavon Paganini. Titus Mould, MD, [place] Pgr: [place] [facility] 03/30/2014 12:46 PM

## 2014-03-31 ENCOUNTER — Other Ambulatory Visit (HOSPITAL_COMMUNITY): Payer: Self-pay

## 2014-03-31 LAB — RENAL FUNCTION PANEL
ALBUMIN: 1.8 g/dL — AB (ref 3.5–5.2)
Anion gap: 11 (ref 5–15)
BUN: 193 mg/dL — AB (ref 6–23)
CO2: 42 mEq/L (ref 19–32)
CREATININE: 2.72 mg/dL — AB (ref 0.50–1.35)
Calcium: 8.7 mg/dL (ref 8.4–10.5)
Chloride: 90 mEq/L — ABNORMAL LOW (ref 96–112)
GFR calc Af Amer: 27 mL/min — ABNORMAL LOW (ref >90)
GFR calc non Af Amer: 23 mL/min — ABNORMAL LOW (ref >90)
GLUCOSE: 97 mg/dL (ref 70–99)
PHOSPHORUS: 5.2 mg/dL — AB (ref 2.3–4.6)
POTASSIUM: 4.9 meq/L (ref 3.7–5.3)
Sodium: 143 mEq/L (ref 137–147)

## 2014-03-31 LAB — PROTIME-INR
INR: 1.96 — AB (ref 0.00–1.49)
PROTHROMBIN TIME: 22.5 s — AB (ref 11.6–15.2)

## 2014-03-31 LAB — DIGOXIN LEVEL: Digoxin Level: 2.2 ng/mL — ABNORMAL HIGH (ref 0.8–2.0)

## 2014-03-31 DEATH — deceased

## 2014-04-01 DIAGNOSIS — J9601 Acute respiratory failure with hypoxia: Secondary | ICD-10-CM | POA: Diagnosis not present

## 2014-04-01 DIAGNOSIS — I82C19 Acute embolism and thrombosis of unspecified internal jugular vein: Secondary | ICD-10-CM | POA: Insufficient documentation

## 2014-04-01 DIAGNOSIS — J80 Acute respiratory distress syndrome: Secondary | ICD-10-CM | POA: Diagnosis not present

## 2014-04-01 DIAGNOSIS — Z452 Encounter for adjustment and management of vascular access device: Secondary | ICD-10-CM | POA: Diagnosis not present

## 2014-04-01 DIAGNOSIS — I82C12 Acute embolism and thrombosis of left internal jugular vein: Secondary | ICD-10-CM

## 2014-04-01 DIAGNOSIS — C3490 Malignant neoplasm of unspecified part of unspecified bronchus or lung: Secondary | ICD-10-CM | POA: Diagnosis not present

## 2014-04-01 LAB — PTH, INTACT AND CALCIUM
Calcium, Total (PTH): 8 mg/dL — ABNORMAL LOW (ref 8.4–10.5)
PTH: 80 pg/mL — ABNORMAL HIGH (ref 14–64)

## 2014-04-01 LAB — PROTIME-INR
INR: 2.6 — AB (ref 0.00–1.49)
PROTHROMBIN TIME: 28 s — AB (ref 11.6–15.2)

## 2014-04-01 NOTE — Progress Notes (Signed)
Name: Joseph Mack MRN: [id number] DOB: [birth year]    ADMISSION DATE:  03/22/2014 CONSULTATION DATE:  03/25/2014  REFERRING MD :  Dahlia Client  CHIEF COMPLAINT:  VDRF  BRIEF PATIENT DESCRIPTION: 63 y.o. M with severe COPD on 8 - 12 L high flow O2 chronically, recently admitted to [facility] and underwent left VATS with bullectomy, complete decortication, wedge resection, visceral pleurectomy.  Unable to be liberated from vent post op, required trach/PEG.  Transferred to [facility] 11/23, PCCM consulted for assistance with vent management.  SIGNIFICANT EVENTS  10/26 - VATS at [facility] (LUL bullectomy, lysis, complete decortication, wedge resection, visceral pleurectomy) 11/10 - trach 11/20 - PEG 11/23 - transferred to [facility] 11/25 - PCCM consulted  STUDIES:  CXR 11/125 >>> pulm edema, emphysema, LUL bullectomy.  SUBJECTIVE:  Now on High FIO2  VITAL SIGNS:  VSS.    PHYSICAL EXAMINATION: General: Morbidly obese male, resting in bed, in NAD. Neuro: Sedated, follows commands intermittently. HEENT: [place]/AT. PERRL, sclerae anicteric.  #6 XLT trach in place. Cardiovascular: RRR, no M/R/G.  Lungs: Respirations shallow, decreased air movement, full vent support. Increased FIO2/PEEP needs Abdomen: Morbidly obese, PEG in place, BS x 4, soft, NT/ND.  Musculoskeletal: No gross deformities, no edema. Venous stasis changes Skin: Intact, warm, no rashes.   Recent Labs Lab 03/28/14 0421 03/29/14 0425 03/30/14 0234 03/31/14 0457  NA 145  --  143 143  K 5.6* 5.0 5.0 4.9  CL 95*  --  91* 90*  CO2 39*  --  39* 42*  BUN 146*  --  183* 193*  CREATININE 2.67*  --  2.78* 2.72*  GLUCOSE 177*  --  115* 97    Recent Labs Lab 03/26/14 0615 03/27/14 0714 03/28/14 0421 03/30/14 0234  HGB 7.1* 8.2* 8.0* 7.0*  HCT 25.8* 29.0* 28.7* 23.8*  WBC 6.6 6.6  --  6.3  PLT 302 290  --  311   Dg Chest Port 1 View  03/31/2014   CLINICAL DATA:  63 year old male status post central line placement.  EXAM: PORTABLE CHEST  - 1 VIEW  COMPARISON:  Prior chest x-ray obtained earlier today at 20:49 p.m.  FINDINGS: The left IJ approach central venous catheter has been repositioned. The tip no longer deflects up into the SVC but terminates in the region of the superior cavoatrial junction. The tracheostomy tube remains in unchanged position. Severe bilateral interstitial and airspace opacities have slightly progressed compared to earlier concerning for worsening of pulmonary edema.  IMPRESSION: Repositioning of left IJ approach central venous catheter. Catheter tip now projects over the superior cavoatrial junction/upper right atrium.  Progressive diffuse bilateral interstitial and airspace disease concerning for worsening pulmonary edema.   Electronically Signed   By: Jacqulynn Cadet M.D.   On: 03/31/2014 21:55   Dg Chest Port 1 View  03/31/2014   CLINICAL DATA:  Left IJ central line position  EXAM: PORTABLE CHEST - 1 VIEW  COMPARISON:  03/31/2014  FINDINGS: Left IJ central line tip is slightly curved at the SVC azygos junction and may be within the proximal azygos vein. Stable chronic diffuse airspace process versus edema with left apical opacification. Right apical bullous disease noted. No significant interval change.  IMPRESSION: Left IJ central line tip slightly curved at the SVC azygos junction as described.   Electronically Signed   By: Daryll Brod M.D.   On: 03/31/2014 21:16   Dg Chest Port 1 View  03/31/2014   CLINICAL DATA:  Central line reposition  EXAM: PORTABLE CHEST -  1 VIEW  COMPARISON:  03/31/2014  FINDINGS: Left IJ central line tip is at the SVC azygos junction and may be within the azygos vein centrally. Tracheostomy noted. Heart remains enlarged with diffuse airspace process versus edema throughout the lungs. Exam is limited because of body habitus and portable technique. Right apical bullous disease noted. No pneumothorax. Stable opacification of the left apex.  IMPRESSION: Left IJ central line tip is slightly  curved at the SVC azygos junction and may be within the proximal central azygos vein.  No pneumothorax  Similar diffuse airspace process versus edema.   Electronically Signed   By: Daryll Brod M.D.   On: 03/31/2014 21:13   Dg Chest Port 1 View  03/31/2014   CLINICAL DATA:  Respiratory failure.  EXAM: PORTABLE CHEST - 1 VIEW  COMPARISON:  03/30/2014, 03/29/2014.  CT 12/25/2013.  FINDINGS: Tracheostomy tube in stable position. Persistent cardiomegaly and diffuse pulmonary interstitial prominence with left-sided pleural effusion. Findings consistent with congestive heart failure. Underlying chronic interstitial lung disease may be present. Persistent unchanged opacification of the left apex. Loculated pleural effusion may be present. No pneumothorax. No acute osseous abnormality.  IMPRESSION: 1. Persistent changes of congestive heart failure with bilateral pulmonary interstitial edema and left pleural effusion. 2. Probable chronic underlying interstitial lung disease. 3. Stable left apical opacity, most likely loculated pleural effusion. Chest x-ray is unchanged from prior exam.   Electronically Signed   By: [place]   On: 03/31/2014 07:44   Dg Chest Port 1 View  03/30/2014   CLINICAL DATA:  Respiratory failure and status post bronchoscopy.  EXAM: PORTABLE CHEST - 1 VIEW  COMPARISON:  0618 hr  FINDINGS: Stable tracheostomy positioning. No pneumothorax. Lungs show persistent left apical consolidation and diffuse pattern of bilateral airspace disease and interstitial prominence/fibrosis. The heart size is stable.  IMPRESSION: No pneumothorax. Relatively stable appearance of bilateral airspace disease and underlying interstitial lung disease/ fibrosis.   Electronically Signed   By: Aletta Edouard M.D.   On: 03/30/2014 17:27    ASSESSMENT / PLAN:  VDRF s/p trach Tracheostomy status Severe COPD per report (no PFT's available) - s/p LUL bullectomy and wedge resection, visceral pleurectomy. Bilateral  pulmonary infiltrates/ARDS  Discussion  looks like he has probably developed some degree of pulmonary fibrosis since Aug this year... Now complicated by what is likely super-imposed ARDS. Film is worse. Not responded to escalated lasix. At ant Steroids were started at some stage. He is clinically much worse. Do not see an outcome here that would be acceptable to this patient. He will not be liberated from the vent and at best would be looking at life long vent dependence even if he could survive. Long discussion w/ wife who desires to transition to comfort (see MD discussion below).   Recs: Keep PEEP at 12; no further escalation  We had taken peep from 10 to 12 to recruit to goal 70% as able Seems we have maxed lasix efforts abx per primary svc DNR Transition to Comfort 12/3  Acute on chronic renal failure w/ worsening BUN Plan Cont lasix as able Not a HD candidate   Anemia of critical illness Plan No role for transfusion   BABCOCK,PETE 04/01/2014 3:29 PM  Attending MD Summary statement:   STAFF NOTE: I, Merrie Roof, MD FACP have personally reviewed patient's available data, including medical history, events of note, physical examination and test results as part of my evaluation. I have discussed with resident/NP and other care providers such as  pharmacist, RN and RRT. In addition, I personally evaluated patient and elicited key findings of: appears uncomfortable, dyschrony, peep increasing, pcxr worsening, extensive d/w wife about his prior wishes, he would nto want to be continued under these circumstances, wife has chosen comfort care after sister visits in am , would need morphine and ativan per wife concern anxiety for RA trach then titrate to rr 12-18 and pain. The patient is critically ill with multiple organ systems failure and requires high complexity decision making for assessment and support, frequent evaluation and titration of therapies, application of advanced  monitoring technologies and extensive interpretation of multiple databases.   Critical Care Time devoted to patient care services described in this note is 30 Minutes. This time reflects time of care of this signee: Merrie Roof, MD FACP. This critical care time does not reflect procedure time, or teaching time or supervisory time of PA/NP/Med student/Med Resident etc but could involve care discussion time. Rest per NP/medical resident whose note is outlined above and that I agree with   Lavon Paganini. Titus Mould, MD, [place] Pgr: [place] [facility] 04/01/2014 4:16 PM

## 2014-04-02 DIAGNOSIS — Z452 Encounter for adjustment and management of vascular access device: Secondary | ICD-10-CM | POA: Diagnosis not present

## 2014-04-02 DIAGNOSIS — C3490 Malignant neoplasm of unspecified part of unspecified bronchus or lung: Secondary | ICD-10-CM | POA: Diagnosis not present

## 2014-04-02 DIAGNOSIS — J9601 Acute respiratory failure with hypoxia: Secondary | ICD-10-CM | POA: Diagnosis not present

## 2014-04-02 DIAGNOSIS — J80 Acute respiratory distress syndrome: Secondary | ICD-10-CM | POA: Diagnosis not present

## 2014-04-02 LAB — BASIC METABOLIC PANEL
ANION GAP: 15 (ref 5–15)
BUN: 219 mg/dL — ABNORMAL HIGH (ref 6–23)
CHLORIDE: 97 meq/L (ref 96–112)
CO2: 38 mEq/L — ABNORMAL HIGH (ref 19–32)
Calcium: 9 mg/dL (ref 8.4–10.5)
Creatinine, Ser: 2.58 mg/dL — ABNORMAL HIGH (ref 0.50–1.35)
GFR calc non Af Amer: 25 mL/min — ABNORMAL LOW (ref >90)
GFR, EST AFRICAN AMERICAN: 29 mL/min — AB (ref >90)
Glucose, Bld: 136 mg/dL — ABNORMAL HIGH (ref 70–99)
POTASSIUM: 5.4 meq/L — AB (ref 3.7–5.3)
SODIUM: 150 meq/L — AB (ref 137–147)

## 2014-04-02 LAB — CBC
HEMATOCRIT: 26 % — AB (ref 39.0–52.0)
Hemoglobin: 7.4 g/dL — ABNORMAL LOW (ref 13.0–17.0)
MCH: 28.9 pg (ref 26.0–34.0)
MCHC: 28.5 g/dL — ABNORMAL LOW (ref 30.0–36.0)
MCV: 101.6 fL — ABNORMAL HIGH (ref 78.0–100.0)
Platelets: 446 10*3/uL — ABNORMAL HIGH (ref 150–400)
RBC: 2.56 MIL/uL — ABNORMAL LOW (ref 4.22–5.81)
RDW: 17.4 % — AB (ref 11.5–15.5)
WBC: 15.1 10*3/uL — AB (ref 4.0–10.5)

## 2014-04-02 LAB — PROTIME-INR
INR: 3.35 — ABNORMAL HIGH (ref 0.00–1.49)
PROTHROMBIN TIME: 34.2 s — AB (ref 11.6–15.2)

## 2014-04-02 LAB — PRO B NATRIURETIC PEPTIDE: PRO B NATRI PEPTIDE: 4066 pg/mL — AB (ref 0–125)

## 2014-04-02 NOTE — Progress Notes (Signed)
Name: Joseph Mack MRN: [id number] DOB: 02/01/1951    ADMISSION DATE:  03/05/2014 CONSULTATION DATE:  03/25/2014  REFERRING MD :  Dahlia Client  CHIEF COMPLAINT:  VDRF  BRIEF PATIENT DESCRIPTION: 63 y.o. 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  Unable to be liberated from vent post op, required trach/PEG.  Transferred to [facility] 11/23, PCCM consulted for assistance with vent management.  SIGNIFICANT EVENTS  10/26 - VATS at [facility] (LUL bullectomy, lysis, complete decortication, wedge resection, visceral pleurectomy) 11/10 - trach 11/20 - PEG 11/23 - transferred to [facility] 11/25 - PCCM consulted 12/3 DNR ? Terminal extubation   STUDIES:    SUBJECTIVE:  Now on High FIO2 100% 12 peep  VITAL SIGNS:  VSS.    PHYSICAL EXAMINATION: General: Morbidly obese male, resting in bed, in NAD. Neuro: Sedated, follows commands intermittently. HEENT: [place]/AT. PERRL, sclerae anicteric.  #6 XLT trach in place. Cardiovascular: RRR, no M/R/G.  Lungs: Respirations shallow, decreased air movement, full vent support. Increased FIO2/PEEP needs Abdomen: Morbidly obese, PEG in place, BS x 4, soft, NT/ND.  Musculoskeletal: No gross deformities, no edema. Venous stasis changes Skin: Intact, warm, no rashes.   Recent Labs Lab 03/30/14 0234 03/31/14 0457 04/02/14 0505  NA 143 143 150*  K 5.0 4.9 5.4*  CL 91* 90* 97  CO2 39* 42* 38*  BUN 183* 193* 219*  CREATININE 2.78* 2.72* 2.58*  GLUCOSE 115* 97 136*    Recent Labs Lab 03/27/14 0714 03/28/14 0421 03/30/14 0234 04/02/14 0505  HGB 8.2* 8.0* 7.0* 7.4*  HCT 29.0* 28.7* 23.8* 26.0*  WBC 6.6  --  6.3 15.1*  PLT 290  --  311 446*   Dg Chest Port 1 View  03/31/2014   CLINICAL DATA:  63 year old male status post central line placement.  EXAM: PORTABLE CHEST - 1 VIEW  COMPARISON:  Prior chest x-ray obtained  earlier today at 20:49 p.m.  FINDINGS: The left IJ approach central venous catheter has been repositioned. The tip no longer deflects up into the SVC but terminates in the region of the superior cavoatrial junction. The tracheostomy tube remains in unchanged position. Severe bilateral interstitial and airspace opacities have slightly progressed compared to earlier concerning for worsening of pulmonary edema.  IMPRESSION: Repositioning of left IJ approach central venous catheter. Catheter tip now projects over the superior cavoatrial junction/upper right atrium.  Progressive diffuse bilateral interstitial and airspace disease concerning for worsening pulmonary edema.   Electronically Signed   By: Jacqulynn Cadet M.D.   On: 03/31/2014 21:55   Dg Chest Port 1 View  03/31/2014   CLINICAL DATA:  Left IJ central line position  EXAM: PORTABLE CHEST - 1 VIEW  COMPARISON:  03/31/2014  FINDINGS: Left IJ central line tip is slightly curved at the SVC azygos junction and may be within the proximal azygos vein. Stable chronic diffuse airspace process versus edema with left apical opacification. Right apical bullous disease noted. No significant interval change.  IMPRESSION: Left IJ central line tip slightly curved at the SVC azygos junction as described.   Electronically Signed   By: Daryll Brod M.D.   On: 03/31/2014 21:16   Dg Chest Port 1 View  03/31/2014   CLINICAL DATA:  Central line reposition  EXAM: PORTABLE CHEST - 1 VIEW  COMPARISON:  03/31/2014  FINDINGS: Left IJ central line tip is at the SVC azygos  junction and may be within the azygos vein centrally. Tracheostomy noted. Heart remains enlarged with diffuse airspace process versus edema throughout the lungs. Exam is limited because of body habitus and portable technique. Right apical bullous disease noted. No pneumothorax. Stable opacification of the left apex.  IMPRESSION: Left IJ central line tip is slightly curved at the SVC azygos junction and may be  within the proximal central azygos vein.  No pneumothorax  Similar diffuse airspace process versus edema.   Electronically Signed   By: Daryll Brod M.D.   On: 03/31/2014 21:13    ASSESSMENT / PLAN:  VDRF s/p trach Tracheostomy status Severe COPD per report (no PFT's available) - s/p LUL bullectomy and wedge resection, visceral pleurectomy. Bilateral pulmonary infiltrates/ARDS  Discussion  looks like he has probably developed some degree of pulmonary fibrosis since Aug this year... Now complicated by what is likely super-imposed ARDS. Film is worse. Not responded to escalated lasix. At ant Steroids were started at some stage. He is clinically much worse. Do not see an outcome here that would be acceptable to this patient. He will not be liberated from the vent and at best would be looking at life long vent dependence even if he could survive. Long discussion w/ wife who desires to transition to comfort . 12/3 he is now DNR, on 100 fio2 and 12 peep. Ongoing discussion with family.  Recs: Keep PEEP at 12; no further escalation  We had taken peep from 10 to 12 to recruit to goal 70% as able Seems we have maxed lasix efforts abx per primary svc DNR Transition to Comfort 12/3 ? Terminal extubation.  Acute on chronic renal failure w/ worsening BUN Plan Cont lasix as able Not a HD candidate   Anemia of critical illness Plan No role for transfusion   [facility] ACNP Maryanna Shape PCCM Pager [phone] till 3 pm If no answer page [phone] 04/02/2014, 8:17 AM  STAFF NOTE: I, Merrie Roof, MD FACP have personally reviewed patient's available data, including medical history, events of note, physical examination and test results as part of my evaluation. I have discussed with resident/NP and other care providers such as pharmacist, RN and RRT. In addition, I personally evaluated patient and elicited key findings of: extensive d/w wife day prior, peep 88, now on 100% again, clear wishes for  comfort under these circumstances, will add morphine to titrateTO RR 12-18 AND PAIN, ADD VERSED DRIP LOW DOSE, FOR ANXIETY DESCRIBED BY WIFE, comfort are out goals, to room air s my recommendation after sister arrives. He is NOT niave to narcs, will require high dose morphine likely, discused on rounds with RN  Lavon Paganini. Titus Mould, MD, [place] Pgr: [facility] 04/02/2014 5:09 PM

## 2014-04-02 NOTE — Progress Notes (Signed)
  Echocardiogram 2D Echocardiogram has been performed.  Darlina Sicilian M 04/02/2014, 12:47 PM

## 2014-05-01 DEATH — deceased

## 2015-07-08 IMAGING — CR DG CHEST 1V PORT
1 series · 1 of 1 positions shown · non-contrast
Comparison: 03/31/2014

CLINICAL DATA: Central line reposition

EXAM:
PORTABLE CHEST - 1 VIEW

[ap portable]
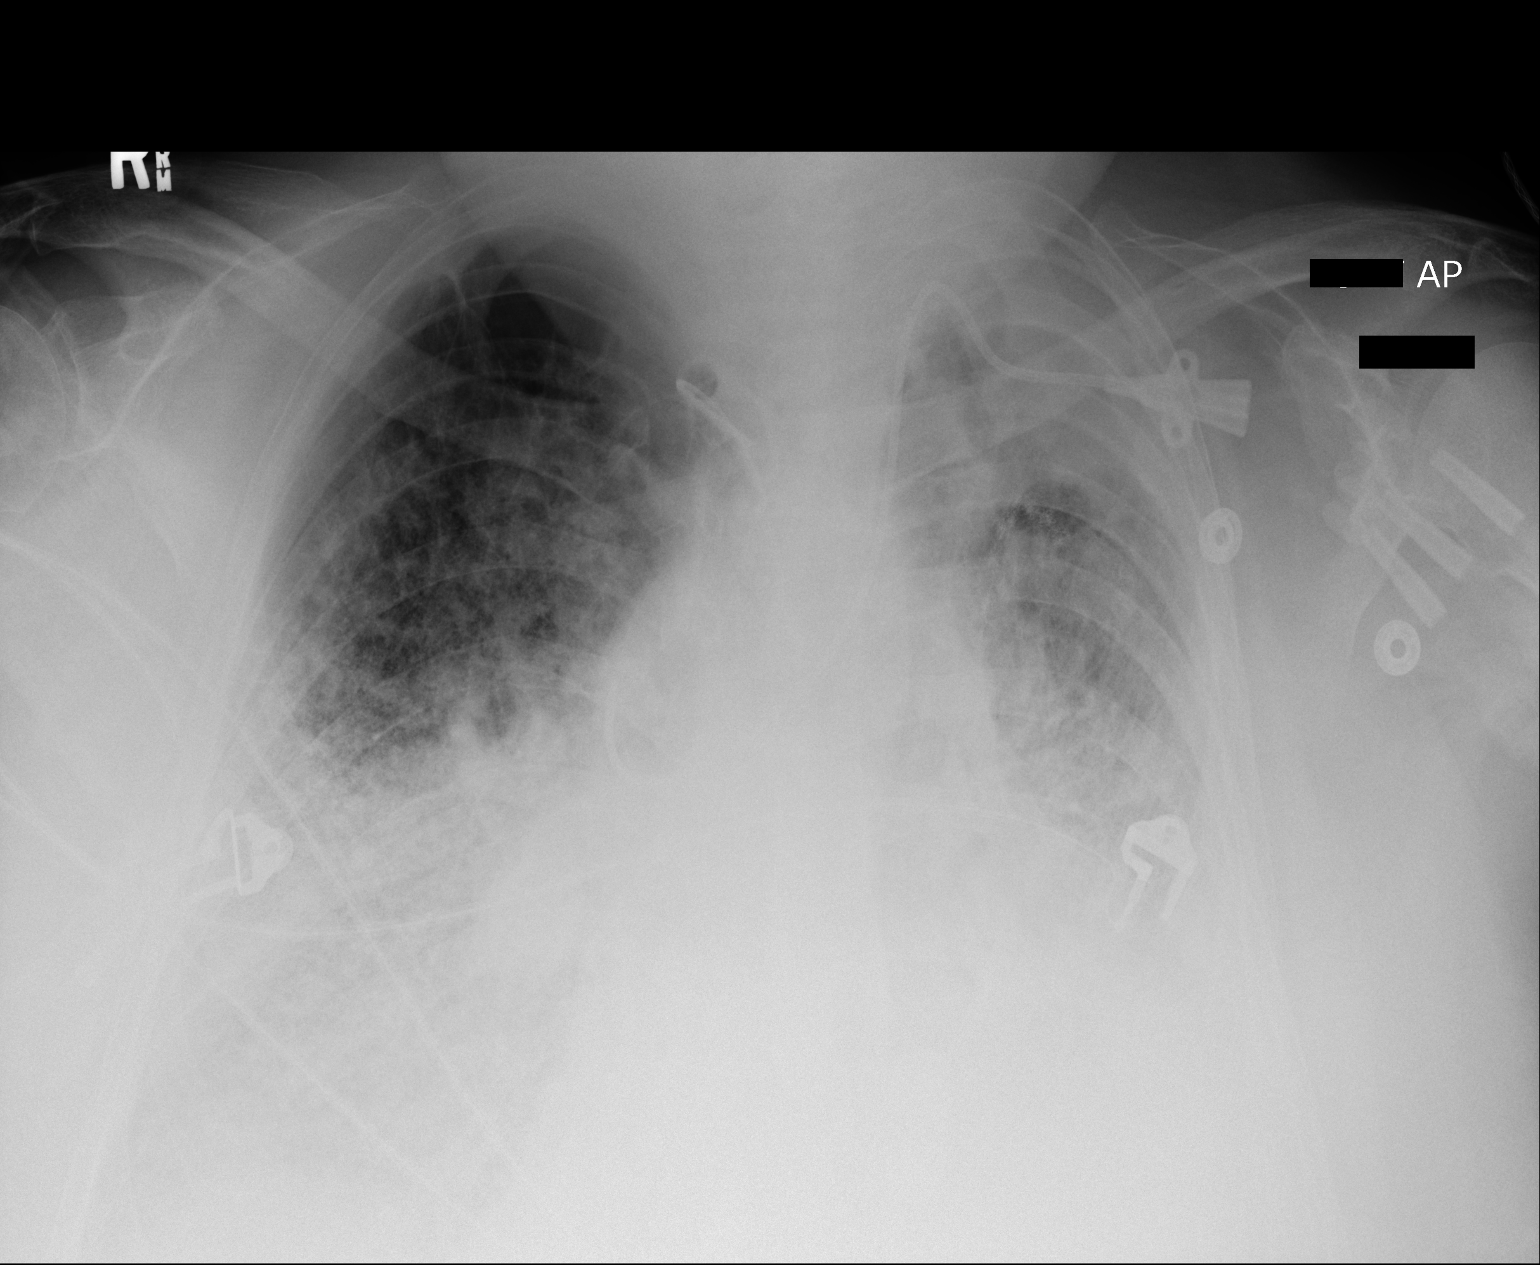

[1 of 1 positions shown; findings below may reference images not displayed]

FINDINGS: Left IJ central line tip is at the SVC azygos junction and may be
within the azygos vein centrally. Tracheostomy noted. Heart remains
enlarged with diffuse airspace process versus edema throughout the
lungs. Exam is limited because of body habitus and portable
technique. Right apical bullous disease noted. No pneumothorax.
Stable opacification of the left apex.
IMPRESSION: Left IJ central line tip is slightly curved at the SVC azygos
junction and may be within the proximal central azygos vein.

No pneumothorax

Similar diffuse airspace process versus edema.
